# Patient Record
Sex: Female | Born: 1995 | Race: White | Hispanic: No | Marital: Single | Smoking: Never smoker
Health system: Southern US, Community
[De-identification: ages and names within clinical notes are randomized; demographics above are authoritative.]

## PROBLEM LIST (undated history)

## (undated) HISTORY — PX: NO PAST SURGERIES: SHX2092

---

## 2014-07-31 ENCOUNTER — Emergency Department: Payer: Self-pay | Admitting: Student

## 2016-10-26 ENCOUNTER — Encounter: Payer: Self-pay | Admitting: Emergency Medicine

## 2016-10-26 ENCOUNTER — Emergency Department
Admission: EM | Admit: 2016-10-26 | Discharge: 2016-10-26 | Disposition: A | Discharge status: Home / Self Care | Payer: Medicaid Other | Attending: Emergency Medicine | Admitting: Emergency Medicine

## 2016-10-26 DIAGNOSIS — R319 Hematuria, unspecified: Secondary | ICD-10-CM | POA: Insufficient documentation

## 2016-10-26 DIAGNOSIS — R109 Unspecified abdominal pain: Secondary | ICD-10-CM | POA: Diagnosis not present

## 2016-10-26 DIAGNOSIS — Z5321 Procedure and treatment not carried out due to patient leaving prior to being seen by health care provider: Secondary | ICD-10-CM | POA: Insufficient documentation

## 2016-10-26 LAB — CBC
HCT: 42.1 % (ref 35.0–47.0)
Hemoglobin: 14.3 g/dL (ref 12.0–16.0)
MCH: 29.4 pg (ref 26.0–34.0)
MCHC: 34 g/dL (ref 32.0–36.0)
MCV: 86.3 fL (ref 80.0–100.0)
PLATELETS: 437 10*3/uL (ref 150–440)
RBC: 4.88 MIL/uL (ref 3.80–5.20)
RDW: 13.5 % (ref 11.5–14.5)
WBC: 13.9 10*3/uL — AB (ref 3.6–11.0)

## 2016-10-26 LAB — URINALYSIS, COMPLETE (UACMP) WITH MICROSCOPIC
Bacteria, UA: NONE SEEN
SPECIFIC GRAVITY, URINE: 1.021 (ref 1.005–1.030)

## 2016-10-26 LAB — COMPREHENSIVE METABOLIC PANEL
ALBUMIN: 4.3 g/dL (ref 3.5–5.0)
ALK PHOS: 67 U/L (ref 38–126)
ALT: 21 U/L (ref 14–54)
AST: 25 U/L (ref 15–41)
Anion gap: 8 (ref 5–15)
BILIRUBIN TOTAL: 0.4 mg/dL (ref 0.3–1.2)
BUN: 11 mg/dL (ref 6–20)
CALCIUM: 9.8 mg/dL (ref 8.9–10.3)
CO2: 28 mmol/L (ref 22–32)
Chloride: 103 mmol/L (ref 101–111)
Creatinine, Ser: 0.79 mg/dL (ref 0.44–1.00)
GFR calc Af Amer: >60 mL/min (ref >60)
GLUCOSE: 99 mg/dL (ref 65–99)
Potassium: 4.2 mmol/L (ref 3.5–5.1)
Sodium: 139 mmol/L (ref 135–145)
TOTAL PROTEIN: 8.7 g/dL — AB (ref 6.5–8.1)

## 2016-10-26 LAB — POCT PREGNANCY, URINE: PREG TEST UR: NEGATIVE

## 2016-10-26 LAB — LIPASE, BLOOD: Lipase: 21 U/L (ref 11–51)

## 2016-10-26 NOTE — ED Provider Notes (Signed)
I did not evaluate the patient. She left the treatment room prior to my evaluation.   Barbara SemenGraydon Shenna Brissette, MD 10/26/16 [phone]

## 2016-10-26 NOTE — ED Notes (Signed)
Pt and significant other apparently walked out after being put in room and seen by rn but prior to being seen by MD. Pt did not say anything to staff about leaving.

## 2016-10-26 NOTE — ED Notes (Signed)
Pt attempted to urinate prior to triage and was unsuccessful, pt does have specimen cup provided by this tech and will continue to try and advised staff when successful

## 2016-10-26 NOTE — ED Triage Notes (Signed)
Pt to ed with c/o abd pain x 1 week.  Denies diarrhea, reports n/v.  Pt reports blood clots noted in urine, also reports back pain.  Pt reports pain with urination.

## 2016-10-28 ENCOUNTER — Telehealth: Payer: Self-pay | Admitting: Emergency Medicine

## 2016-10-28 NOTE — Telephone Encounter (Signed)
Called patient due to lwot to inquire about condition and follow up plans. pateint says it was taking too long.  I explained that she really needs to see a doctor somewhere, and they need to review her labs.  Explained that blood in urine needs to be investigated to check her kidneys.

## 2017-06-19 DIAGNOSIS — K529 Noninfective gastroenteritis and colitis, unspecified: Secondary | ICD-10-CM | POA: Insufficient documentation

## 2017-06-19 DIAGNOSIS — R112 Nausea with vomiting, unspecified: Secondary | ICD-10-CM | POA: Insufficient documentation

## 2017-06-27 ENCOUNTER — Emergency Department: Payer: Self-pay

## 2017-06-27 ENCOUNTER — Emergency Department
Admission: EM | Admit: 2017-06-27 | Discharge: 2017-06-27 | Disposition: A | Discharge status: Home / Self Care | Payer: Self-pay | Attending: Emergency Medicine | Admitting: Emergency Medicine

## 2017-06-27 ENCOUNTER — Encounter: Payer: Self-pay | Admitting: Radiology

## 2017-06-27 DIAGNOSIS — K5900 Constipation, unspecified: Secondary | ICD-10-CM

## 2017-06-27 DIAGNOSIS — R112 Nausea with vomiting, unspecified: Secondary | ICD-10-CM | POA: Insufficient documentation

## 2017-06-27 DIAGNOSIS — R109 Unspecified abdominal pain: Secondary | ICD-10-CM

## 2017-06-27 DIAGNOSIS — R1030 Lower abdominal pain, unspecified: Secondary | ICD-10-CM | POA: Insufficient documentation

## 2017-06-27 LAB — COMPREHENSIVE METABOLIC PANEL
ALK PHOS: 52 U/L (ref 38–126)
ALT: 35 U/L (ref 14–54)
AST: 30 U/L (ref 15–41)
Albumin: 4.5 g/dL (ref 3.5–5.0)
Anion gap: 12 (ref 5–15)
BILIRUBIN TOTAL: 0.8 mg/dL (ref 0.3–1.2)
BUN: 6 mg/dL (ref 6–20)
CALCIUM: 10.1 mg/dL (ref 8.9–10.3)
CO2: 28 mmol/L (ref 22–32)
CREATININE: 0.83 mg/dL (ref 0.44–1.00)
Chloride: 95 mmol/L — ABNORMAL LOW (ref 101–111)
Glucose, Bld: 107 mg/dL — ABNORMAL HIGH (ref 65–99)
Potassium: 3.1 mmol/L — ABNORMAL LOW (ref 3.5–5.1)
SODIUM: 135 mmol/L (ref 135–145)
Total Protein: 8.2 g/dL — ABNORMAL HIGH (ref 6.5–8.1)

## 2017-06-27 LAB — CBC
HCT: 43.2 % (ref 35.0–47.0)
Hemoglobin: 14.4 g/dL (ref 12.0–16.0)
MCH: 28.3 pg (ref 26.0–34.0)
MCHC: 33.4 g/dL (ref 32.0–36.0)
MCV: 84.9 fL (ref 80.0–100.0)
Platelets: 375 10*3/uL (ref 150–440)
RBC: 5.09 MIL/uL (ref 3.80–5.20)
RDW: 14.2 % (ref 11.5–14.5)
WBC: 13.3 10*3/uL — ABNORMAL HIGH (ref 3.6–11.0)

## 2017-06-27 LAB — LIPASE, BLOOD: Lipase: 60 U/L — ABNORMAL HIGH (ref 11–51)

## 2017-06-27 LAB — POCT PREGNANCY, URINE: Preg Test, Ur: NEGATIVE

## 2017-06-27 MED ORDER — MORPHINE SULFATE (PF) 4 MG/ML IV SOLN
4.0000 mg | Freq: Once | INTRAVENOUS | Status: AC
  Administered 2017-06-27: 4 mg via INTRAVENOUS
  Filled 2017-06-27: qty 1

## 2017-06-27 MED ORDER — SODIUM CHLORIDE 0.9 % IV BOLUS (SEPSIS)
1000.0000 mL | Freq: Once | INTRAVENOUS | Status: AC
Start: 2017-06-27 — End: 2017-06-27
  Administered 2017-06-27: 1000 mL via INTRAVENOUS

## 2017-06-27 MED ORDER — DOCUSATE SODIUM 100 MG PO CAPS: 100.0000 mg | ORAL_CAPSULE | Freq: Every day | ORAL | 0 refills | Status: AC | PRN

## 2017-06-27 MED ORDER — METOCLOPRAMIDE HCL 5 MG/ML IJ SOLN
INTRAMUSCULAR | Status: AC
  Filled 2017-06-27: qty 2

## 2017-06-27 MED ORDER — METOCLOPRAMIDE HCL 10 MG PO TABS: 10.0000 mg | ORAL_TABLET | Freq: Four times a day (QID) | ORAL | 0 refills | Status: DC | PRN

## 2017-06-27 MED ORDER — IOPAMIDOL (ISOVUE-300) INJECTION 61%
30.0000 mL | Freq: Once | INTRAVENOUS | Status: AC | PRN
  Administered 2017-06-27: 30 mL via ORAL
  Filled 2017-06-27: qty 30

## 2017-06-27 MED ORDER — IOPAMIDOL (ISOVUE-300) INJECTION 61%
100.0000 mL | Freq: Once | INTRAVENOUS | Status: AC | PRN
  Administered 2017-06-27: 100 mL via INTRAVENOUS
  Filled 2017-06-27: qty 100

## 2017-06-27 MED ORDER — ONDANSETRON HCL 4 MG/2ML IJ SOLN
4.0000 mg | Freq: Once | INTRAMUSCULAR | Status: AC
  Administered 2017-06-27: 4 mg via INTRAVENOUS
  Filled 2017-06-27: qty 2

## 2017-06-27 MED ORDER — METOCLOPRAMIDE HCL 5 MG/ML IJ SOLN
10.0000 mg | Freq: Once | INTRAMUSCULAR | Status: AC
  Administered 2017-06-27: 10 mg via INTRAVENOUS

## 2017-06-27 NOTE — ED Notes (Signed)
Iv started   meds given   Pt drinking po contrast  Family with pt

## 2017-06-27 NOTE — ED Notes (Signed)

## 2017-06-27 NOTE — ED Notes (Signed)
Called CT that patient done with contrast

## 2017-06-27 NOTE — ED Provider Notes (Addendum)
[facility] Emergency Department Provider Note  ____________________________________________   First MD Initiated Contact with Patient 06/27/17 1804     (approximate)  I have reviewed the triage vital signs and the nursing notes.   HISTORY  Chief Complaint Emesis   HPI Barbara Aguilar is a [age] y.o. female with a recent diagnosis of colitis 1 week ago who is presenting to the emergency department today with a 6 out of 10 lower abdominal cramping. She says that she is concerned because after being [facility] one [facility] she has continued to have nausea vomiting and constipation. She says that she is unable to tolerate anything by mouth including Cipro and Flagyl which she was given for home use. She says that she is also on her period and thought that she may be having pain from. Cramps but she says this pain is much worse. Also tried MiraLAX without any bowel movements over the past 3 days.   History reviewed. No pertinent past medical history.  There are no active problems to display for this patient.   No past surgical history on file.  Prior to Admission medications   Not on File    Allergies Patient has no known allergies.  No family history on file.  Social History Social History  Substance Use Topics  . Smoking status: Never Smoker  . Smokeless tobacco: Never Used  . Alcohol use No    Review of Systems  Constitutional: No fever/chills Eyes: No visual changes. ENT: No sore throat. Cardiovascular: Denies chest pain. Respiratory: Denies shortness of breath. Gastrointestinal:  No diarrhea.   Genitourinary: Negative for dysuria. Musculoskeletal: Negative for back pain. Skin: Negative for rash. Neurological: Negative for headaches, focal weakness or numbness.   ____________________________________________   PHYSICAL EXAM:  VITAL SIGNS: ED Triage Vitals [06/27/17 1619]  Enc Vitals Group     BP (!) 144/92     Pulse  Rate 80     Resp 15     Temp 98.5 F (36.9 C)     Temp Source Oral     SpO2 99 %     Weight 230 lb (104.3 kg)     Height 5\' 4"  (1.626 m)     Head Circumference      Peak Flow      Pain Score 7     Pain Loc      Pain Edu?      Excl. in GC?     Constitutional: Alert and oriented. Well appearing and in no acute distress. Eyes: Conjunctivae are normal.  Head: Atraumatic. Nose: No congestion/rhinnorhea. Mouth/Throat: Mucous membranes are moist.  Neck: No stridor.   Cardiovascular: Normal rate, regular rhythm. Grossly normal heart sounds.  Respiratory: Normal respiratory effort.  No retractions. Lungs CTAB. Gastrointestinal: Soft and nontender. No distention. No CVA tenderness. Musculoskeletal: No lower extremity tenderness nor edema.  No joint effusions. Neurologic:  Normal speech and language. No gross focal neurologic deficits are appreciated. Skin:  Skin is warm, dry and intact. No rash noted. Psychiatric: Mood and affect are normal. Speech and behavior are normal.  ____________________________________________   LABS (all labs ordered are listed, but only abnormal results are displayed)  Labs Reviewed  LIPASE, BLOOD - Abnormal; Notable for the following:       Result Value   Lipase 60 (*)    All other components within normal limits  COMPREHENSIVE METABOLIC PANEL - Abnormal; Notable for the following:    Potassium 3.1 (*)  Chloride 95 (*)    Glucose, Bld 107 (*)    Total Protein 8.2 (*)    All other components within normal limits  CBC - Abnormal; Notable for the following:    WBC 13.3 (*)    All other components within normal limits  URINALYSIS, COMPLETE (UACMP) WITH MICROSCOPIC  POCT PREGNANCY, URINE   ____________________________________________  EKG   ____________________________________________  RADIOLOGY  No acute pathology on the CT scan of the abdomen and pelvis. ____________________________________________   PROCEDURES  Procedure(s)  performed:   Procedures  Critical Care performed:   ____________________________________________   INITIAL IMPRESSION / ASSESSMENT AND PLAN / ED COURSE  Pertinent labs & imaging results that were available during my care of the patient were reviewed by me and considered in my medical decision making (see chart for details).    Clinical Course as of Jun 27 2216  Mon Jun 27, 2017  2101 Patient vomited her contrast. Says that she is still experiencing her nausea. CT scan was reassuring.  [DS]    Clinical Course User Index [DS] Myrna Blazer, MD   ----------------------------------------- 10:17 PM on 06/27/2017 -----------------------------------------  Patient is pain-free at this time and able to tolerate by mouth fluids and solids after Reglan. She'll be discharge with Reglan as well as Colace. She will discontinue her MiraLAX. Patient is understanding the plan and willing to comply. She has follow-up scheduled for October a [facility] primary care as this is the soonest ointment that she can obtain. She knows to return to the hospital for any worsening or concerning symptoms. Repeat exam With very minimal tenderness to palpation diffusely. Patient without any distress at this time.  ____________________________________________   FINAL CLINICAL IMPRESSION(S) / ED DIAGNOSES  Abdominal pain with nausea and vomiting.     NEW MEDICATIONS STARTED DURING THIS VISIT:  New Prescriptions   No medications on file     Note:  This document was prepared using Dragon voice recognition software and may include unintentional dictation errors.     Myrna Blazer, MD 06/27/17 2218    Myrna Blazer, MD 06/27/17 2226

## 2017-06-27 NOTE — ED Notes (Signed)
Patient presents to the ED with lower abdominal pain, heart burn and vomiting since Saturday.  Patient also reports constipation x 3 days.  Patient states abdomen feels sore from vomiting as well.  Patient had a similar episode approx. 1 week ago.  Patient states if she eats or drinks pain and vomiting increases.

## 2017-06-27 NOTE — ED Triage Notes (Signed)
[facility] for [facility] and constipation, admitted over night. Pt reports continued constipation, abdominal pain and vomiting. Pt with 200cc of yellow vomit in bag in triage.

## 2017-07-04 ENCOUNTER — Emergency Department
Admission: EM | Admit: 2017-07-04 | Discharge: 2017-07-04 | Disposition: A | Discharge status: Home / Self Care | Payer: Medicaid Other | Attending: Emergency Medicine | Admitting: Emergency Medicine

## 2017-07-04 ENCOUNTER — Encounter: Payer: Self-pay | Admitting: Emergency Medicine

## 2017-07-04 DIAGNOSIS — R112 Nausea with vomiting, unspecified: Secondary | ICD-10-CM

## 2017-07-04 DIAGNOSIS — R197 Diarrhea, unspecified: Secondary | ICD-10-CM

## 2017-07-04 DIAGNOSIS — R1111 Vomiting without nausea: Secondary | ICD-10-CM | POA: Insufficient documentation

## 2017-07-04 LAB — URINE DRUG SCREEN, QUALITATIVE (ARMC ONLY)
Amphetamines, Ur Screen: NOT DETECTED
BENZODIAZEPINE, UR SCRN: NOT DETECTED
Barbiturates, Ur Screen: NOT DETECTED
CANNABINOID 50 NG, UR ~~LOC~~: POSITIVE — AB
Cocaine Metabolite,Ur ~~LOC~~: NOT DETECTED
MDMA (ECSTASY) UR SCREEN: NOT DETECTED
METHADONE SCREEN, URINE: NOT DETECTED
Opiate, Ur Screen: NOT DETECTED
Phencyclidine (PCP) Ur S: NOT DETECTED
TRICYCLIC, UR SCREEN: NOT DETECTED

## 2017-07-04 LAB — CBC
HEMATOCRIT: 40.9 % (ref 35.0–47.0)
Hemoglobin: 14.1 g/dL (ref 12.0–16.0)
MCH: 29.5 pg (ref 26.0–34.0)
MCHC: 34.4 g/dL (ref 32.0–36.0)
MCV: 85.9 fL (ref 80.0–100.0)
Platelets: 400 10*3/uL (ref 150–440)
RBC: 4.76 MIL/uL (ref 3.80–5.20)
RDW: 14.2 % (ref 11.5–14.5)
WBC: 12.3 10*3/uL — ABNORMAL HIGH (ref 3.6–11.0)

## 2017-07-04 LAB — URINALYSIS, COMPLETE (UACMP) WITH MICROSCOPIC
BACTERIA UA: NONE SEEN
Bilirubin Urine: NEGATIVE
Glucose, UA: NEGATIVE mg/dL
Hgb urine dipstick: NEGATIVE
Ketones, ur: 20 mg/dL — AB
Leukocytes, UA: NEGATIVE
NITRITE: NEGATIVE
Protein, ur: 30 mg/dL — AB
Specific Gravity, Urine: 1.021 (ref 1.005–1.030)
pH: 6 (ref 5.0–8.0)

## 2017-07-04 LAB — COMPREHENSIVE METABOLIC PANEL
ALBUMIN: 4.5 g/dL (ref 3.5–5.0)
ALT: 43 U/L (ref 14–54)
AST: 41 U/L (ref 15–41)
Alkaline Phosphatase: 51 U/L (ref 38–126)
Anion gap: 10 (ref 5–15)
BILIRUBIN TOTAL: 0.6 mg/dL (ref 0.3–1.2)
CO2: 25 mmol/L (ref 22–32)
Calcium: 9.9 mg/dL (ref 8.9–10.3)
Chloride: 103 mmol/L (ref 101–111)
Creatinine, Ser: 0.71 mg/dL (ref 0.44–1.00)
GFR calc Af Amer: >60 mL/min (ref >60)
GFR calc non Af Amer: >60 mL/min (ref >60)
GLUCOSE: 141 mg/dL — AB (ref 65–99)
POTASSIUM: 3.7 mmol/L (ref 3.5–5.1)
Sodium: 138 mmol/L (ref 135–145)
TOTAL PROTEIN: 8.1 g/dL (ref 6.5–8.1)

## 2017-07-04 LAB — POCT PREGNANCY, URINE: PREG TEST UR: NEGATIVE

## 2017-07-04 LAB — LIPASE, BLOOD: Lipase: 42 U/L (ref 11–51)

## 2017-07-04 MED ORDER — ONDANSETRON 4 MG PO TBDP
4.0000 mg | ORAL_TABLET | Freq: Once | ORAL | Status: AC | PRN
  Administered 2017-07-04: 4 mg via ORAL
  Filled 2017-07-04: qty 1

## 2017-07-04 MED ORDER — SODIUM CHLORIDE 0.9 % IV BOLUS (SEPSIS)
1000.0000 mL | Freq: Once | INTRAVENOUS | Status: AC
  Administered 2017-07-04: 1000 mL via INTRAVENOUS

## 2017-07-04 MED ORDER — METOCLOPRAMIDE HCL 10 MG PO TABS: 10.0000 mg | ORAL_TABLET | Freq: Four times a day (QID) | ORAL | 0 refills | Status: DC | PRN

## 2017-07-04 MED ORDER — HALOPERIDOL LACTATE 5 MG/ML IJ SOLN
2.5000 mg | Freq: Once | INTRAMUSCULAR | Status: AC
  Administered 2017-07-04: 2.5 mg via INTRAVENOUS
  Filled 2017-07-04: qty 1
  Filled 2017-07-04: qty 0.5

## 2017-07-04 MED ORDER — PROMETHAZINE HCL 25 MG/ML IJ SOLN
12.5000 mg | Freq: Once | INTRAMUSCULAR | Status: AC
Start: 2017-07-04 — End: 2017-07-04
  Administered 2017-07-04: 12.5 mg via INTRAVENOUS
  Filled 2017-07-04: qty 1

## 2017-07-04 NOTE — ED Triage Notes (Signed)
FIRST NURSE NOTE-PT abdominal problems. Seen here last week for same, ran out of nausea medicine and cant see doctor until October.

## 2017-07-04 NOTE — ED Notes (Signed)
Pt not able to hold water down that was given. Some emesis noted in emesis bag. Will notify edp.

## 2017-07-04 NOTE — ED Provider Notes (Addendum)
[facility] Emergency Department Provider Note  ____________________________________________   I have reviewed the triage vital signs and the nursing notes.   HISTORY  Chief Complaint Abdominal Pain and Emesis    HPI Barbara Aguilar is a 21 y.o. female Who presents today complaining of intermittent episodes of vomiting. This has been going on for well over a month she states.she was advised to stop using marijuanabecause of this several weeks ago, and states that she has not recently used it. Hshe has had 2 negative CT scans in the last 3-4 weeks because of this recurrent symptom. She had something similara year and a half ago she states. She is supposed to follow closely with GI medicine but has yet to be able to get into see them. She did have some loose stools this time. She denies any fever or chills, she states besides the vomiting she has no other complaints at this time except for a few loose stools. She was better for the whole week that over the weekend she began to have symptoms again. She denies using marijuana again.no recent antibiotics no recent travel no vaginal discharge no symptoms of PID declines pelvic exam.   History reviewed. No pertinent past medical history.  There are no active problems to display for this patient.   History reviewed. No pertinent surgical history.  Prior to Admission medications   Medication Sig Start Date End Date Taking? Authorizing Provider  docusate sodium (COLACE) 100 MG capsule Take 1 capsule (100 mg total) by mouth daily as needed for mild constipation or moderate constipation. 06/27/17 06/27/18  Myrna Blazer, MD  metoCLOPramide (REGLAN) 10 MG tablet Take 1 tablet (10 mg total) by mouth every 6 (six) hours as needed. 06/27/17   Schaevitz, Myra Rude, MD    Allergies Patient has no known allergies.  No family history on file.  Social History Social History  Substance Use Topics  . Smoking  status: Never Smoker  . Smokeless tobacco: Never Used  . Alcohol use No    Review of Systems Constitutional: No fever/chills Eyes: No visual changes. ENT: No sore throat. No stiff neck no neck pain Cardiovascular: Denies chest pain. Respiratory: Denies shortness of breath. Gastrointestinal:   see history of present illness Genitourinary: Negative for dysuria. Musculoskeletal: Negative lower extremity swelling Skin: Negative for rash. Neurological: Negative for severe headaches, focal weakness or numbness.   ____________________________________________   PHYSICAL EXAM:  VITAL SIGNS: ED Triage Vitals  Enc Vitals Group     BP 07/04/17 1112 (!) 151/92     Pulse Rate 07/04/17 1112 90     Resp 07/04/17 1112 16     Temp 07/04/17 1112 99.3 F (37.4 C)     Temp Source 07/04/17 1112 Oral     SpO2 07/04/17 1112 98 %     Weight 07/04/17 1113 230 lb (104.3 kg)     Height 07/04/17 1113 5\' 4"  (1.626 m)     Head Circumference --      Peak Flow --      Pain Score 07/04/17 1112 9     Pain Loc --      Pain Edu? --      Excl. in GC? --     Constitutional: Alert and oriented. Well appearing and in no acute distress. Eyes: Conjunctivae are normal Head: Atraumatic HEENT: No congestion/rhinnorhea. Mucous membranes are moist.  Oropharynx non-erythematous Neck:   Nontender with no meningismus, no masses, no stridor Cardiovascular: Normal rate, regular rhythm.  Grossly normal heart sounds.  Good peripheral circulation. Respiratory: Normal respiratory effort.  No retractions. Lungs CTAB. Abdominal: Soft and nontender. No distention. No guarding no rebound Back:  There is no focal tenderness or step off.  there is no midline tenderness there are no lesions noted. there is no CVA tenderness Declines pelvic Musculoskeletal: No lower extremity tenderness, no upper extremity tenderness. No joint effusions, no DVT signs strong distal pulses no edema Neurologic:  Normal speech and language. No  gross focal neurologic deficits are appreciated.  Skin:  Skin is warm, dry and intact. No rash noted. Psychiatric: Mood and affect are normal. Speech and behavior are normal.  ____________________________________________   LABS (all labs ordered are listed, but only abnormal results are displayed)  Labs Reviewed  COMPREHENSIVE METABOLIC PANEL - Abnormal; Notable for the following:       Result Value   Glucose, Bld 141 (*)    BUN <5 (*)    All other components within normal limits  CBC - Abnormal; Notable for the following:    WBC 12.3 (*)    All other components within normal limits  URINALYSIS, COMPLETE (UACMP) WITH MICROSCOPIC - Abnormal; Notable for the following:    Color, Urine YELLOW (*)    APPearance HAZY (*)    Ketones, ur 20 (*)    Protein, ur 30 (*)    Squamous Epithelial / LPF 6-30 (*)    All other components within normal limits  URINE DRUG SCREEN, QUALITATIVE ([facility] ONLY) - Abnormal; Notable for the following:    Cannabinoid 50 Ng, Ur [place] POSITIVE (*)    All other components within normal limits  LIPASE, BLOOD  POC URINE PREG, ED  POCT PREGNANCY, URINE   ____________________________________________  EKG  I personally interpreted any EKGs ordered by me or triage  ____________________________________________  RADIOLOGY  I reviewed any imaging ordered by me or triage that were performed during my shift and, if possible, patient and/or family made aware of any abnormal findings. ____________________________________________   PROCEDURES  Procedure(s) performed: None  Procedures  Critical Care performed: None  ____________________________________________   INITIAL IMPRESSION / ASSESSMENT AND PLAN / ED COURSE  Pertinent labs & imaging results that were available during my care of the patient were reviewed by me and considered in my medical decision making (see chart for details).  Patient with chronic recurrent nausea and vomiting presents today with  nausea and vomiting. She is very well-appearing. Her abdomen is benign. She's had 2 negative CT scans for this. There is no focal right upper quadrant pain or elevated liver function tests to suggest this is a gallbladder issue.nothing to suggest pancreatic issue,appendicitis pregnancy or PID and she declines pelvic exam. Nothing to suggest ovarian cyst given chronicity and exam.patient is positive again for cannabinoid. My suspicion that this is possibly related to marijuana abuse. I have counseled her on this again. She will require close outpatient follow-up with GI. She is not markedly dehydrated. Nonetheless we gave her fluids. She did express understanding of the fact that she is not supposed to drive after getting Phengan. She states her nausea was very well controlled with the Reglan she went home with last time. However, she has run out.  ----------------------------------------- 5:19 PM on 07/04/2017 -----------------------------------------  Abdomen remains benign however patient has vomited again. We will try Haldol as an my expenses to his work well for these types of emesis. Patient is positive for cannabis despite telling me that she has not used it for 3  weeks. She states "maybe had been around people using it"  don't think there is any utility in repeating imaging she declines pelvic exam she has no right upper quadrant pain and her gallbladder is visualized twice on CT is completely normal despite ell over a month of symptoms that she now describes it, liver function tests are reassuring, etc. I don't think ultrasound is indicated. We will continue to treat her symptoms.    ____________________________________________   FINAL CLINICAL IMPRESSION(S) / ED DIAGNOSES  Final diagnoses:  None      This chart was dictated using voice recognition software.  Despite best efforts to proofread,  errors can occur which can change meaning.      Jeanmarie Plant, MD 07/04/17 1631     Jeanmarie Plant, MD 07/04/17 [phone]

## 2017-07-04 NOTE — Discharge Instructions (Signed)
Return to the emergency room for new or worrisome symptoms. It is possible, that the vomiting is because of marijuana use. Please do not smoke marijuana. If you have severe pain, persistent vomiting or new or worsening symptoms please return to the emergency department. This would include fever, vaginal discharge, or any other concerning symptoms. Do not drive after receiving Phenergan in the emergency room.

## 2017-07-04 NOTE — ED Triage Notes (Signed)
Pt says she has had vomiting, nausea for about a month.  Is scheduled for gi in October.  Ran out of nausea med and cant keep anything down.

## 2017-07-04 NOTE — ED Notes (Signed)
Pt asking for something to drink, edp notified and orders give pt water PO and hold off on IV fluids to see if she can tolerate.

## 2017-07-08 ENCOUNTER — Emergency Department
Admission: EM | Admit: 2017-07-08 | Discharge: 2017-07-08 | Disposition: A | Discharge status: Home / Self Care | Payer: Self-pay | Attending: Emergency Medicine | Admitting: Emergency Medicine

## 2017-07-08 ENCOUNTER — Encounter: Payer: Self-pay | Admitting: *Deleted

## 2017-07-08 DIAGNOSIS — G8929 Other chronic pain: Secondary | ICD-10-CM | POA: Insufficient documentation

## 2017-07-08 DIAGNOSIS — R112 Nausea with vomiting, unspecified: Secondary | ICD-10-CM | POA: Insufficient documentation

## 2017-07-08 DIAGNOSIS — Z79899 Other long term (current) drug therapy: Secondary | ICD-10-CM | POA: Insufficient documentation

## 2017-07-08 DIAGNOSIS — R109 Unspecified abdominal pain: Secondary | ICD-10-CM | POA: Insufficient documentation

## 2017-07-08 LAB — COMPREHENSIVE METABOLIC PANEL
ALK PHOS: 46 U/L (ref 38–126)
ALT: 29 U/L (ref 14–54)
AST: 31 U/L (ref 15–41)
Albumin: 4.4 g/dL (ref 3.5–5.0)
Anion gap: 9 (ref 5–15)
BILIRUBIN TOTAL: 0.6 mg/dL (ref 0.3–1.2)
CO2: 26 mmol/L (ref 22–32)
CREATININE: 1.03 mg/dL — AB (ref 0.44–1.00)
Calcium: 9.9 mg/dL (ref 8.9–10.3)
Chloride: 101 mmol/L (ref 101–111)
GFR calc Af Amer: >60 mL/min (ref >60)
Glucose, Bld: 137 mg/dL — ABNORMAL HIGH (ref 65–99)
Potassium: 3.5 mmol/L (ref 3.5–5.1)
Sodium: 136 mmol/L (ref 135–145)
TOTAL PROTEIN: 8 g/dL (ref 6.5–8.1)

## 2017-07-08 LAB — CBC
HCT: 42 % (ref 35.0–47.0)
Hemoglobin: 14 g/dL (ref 12.0–16.0)
MCH: 29.1 pg (ref 26.0–34.0)
MCHC: 33.5 g/dL (ref 32.0–36.0)
MCV: 87.1 fL (ref 80.0–100.0)
PLATELETS: 390 10*3/uL (ref 150–440)
RBC: 4.82 MIL/uL (ref 3.80–5.20)
RDW: 14 % (ref 11.5–14.5)
WBC: 11.1 10*3/uL — AB (ref 3.6–11.0)

## 2017-07-08 LAB — LIPASE, BLOOD: Lipase: 24 U/L (ref 11–51)

## 2017-07-08 LAB — URINALYSIS, COMPLETE (UACMP) WITH MICROSCOPIC
BILIRUBIN URINE: NEGATIVE
Glucose, UA: NEGATIVE mg/dL
HGB URINE DIPSTICK: NEGATIVE
Ketones, ur: 5 mg/dL — AB
LEUKOCYTES UA: NEGATIVE
NITRITE: NEGATIVE
PH: 7 (ref 5.0–8.0)
Protein, ur: 30 mg/dL — AB
SPECIFIC GRAVITY, URINE: 1.019 (ref 1.005–1.030)

## 2017-07-08 MED ORDER — SODIUM CHLORIDE 0.9 % IV BOLUS (SEPSIS)
1000.0000 mL | Freq: Once | INTRAVENOUS | Status: AC
  Administered 2017-07-08: 1000 mL via INTRAVENOUS

## 2017-07-08 MED ORDER — METOCLOPRAMIDE HCL 10 MG PO TABS: 10.0000 mg | ORAL_TABLET | Freq: Four times a day (QID) | ORAL | 0 refills | Status: AC | PRN

## 2017-07-08 MED ORDER — ONDANSETRON HCL 4 MG/2ML IJ SOLN
4.0000 mg | Freq: Once | INTRAMUSCULAR | Status: AC
  Administered 2017-07-08: 4 mg via INTRAVENOUS
  Filled 2017-07-08: qty 2

## 2017-07-08 MED ORDER — METOCLOPRAMIDE HCL 5 MG/ML IJ SOLN
10.0000 mg | Freq: Once | INTRAMUSCULAR | Status: AC
  Administered 2017-07-08: 10 mg via INTRAVENOUS
  Filled 2017-07-08: qty 2

## 2017-07-08 MED ORDER — ONDANSETRON 4 MG PO TBDP: 4.0000 mg | ORAL_TABLET | Freq: Three times a day (TID) | ORAL | 0 refills | Status: AC | PRN

## 2017-07-08 NOTE — ED Provider Notes (Signed)
[facility] Emergency Department Provider Note  ____________________________________________   None    (approximate)  I have reviewed the triage vital signs and the nursing notes.   HISTORY  Chief Complaint Abdominal Pain   HPI Barbara Aguilar is a 21 y.o. female is here complaining of diffused abdominal pain with nausea and vomiting. Patient has an appointment with the GI department on Monday. Patient has been in the emergency department numerous times with same symptoms. Patient has had numerous workups for the same which have been negative. She states that she has been taking Reglan since her last ER visit but only has to tablets left. She denies any fever or chills. Patient states this is the same symptoms that she has experienced in the past. She continues to drink water. Patient states that she was going to see someone in the GI department but could not get an appointment until October. She called today and is able to see someone on Monday morning. Currently she rates her pain is 3/10.   History reviewed. No pertinent past medical history.  There are no active problems to display for this patient.   History reviewed. No pertinent surgical history.  Prior to Admission medications   Medication Sig Start Date End Date Taking? Authorizing Provider  docusate sodium (COLACE) 100 MG capsule Take 1 capsule (100 mg total) by mouth daily as needed for mild constipation or moderate constipation. 06/27/17 06/27/18  Myrna Blazer, MD  metoCLOPramide (REGLAN) 10 MG tablet Take 1 tablet (10 mg total) by mouth every 6 (six) hours as needed. 07/08/17   Tommi Rumps, PA-C  ondansetron (ZOFRAN ODT) 4 MG disintegrating tablet Take 1 tablet (4 mg total) by mouth every 8 (eight) hours as needed for nausea or vomiting. 07/08/17   Tommi Rumps, PA-C    Allergies Patient has no known allergies.  No family history on file.  Social History Social  History  Substance Use Topics  . Smoking status: Never Smoker  . Smokeless tobacco: Never Used  . Alcohol use No    Review of Systems  Constitutional: No fever/chills Cardiovascular: Denies chest pain. Respiratory: Denies shortness of breath. Gastrointestinal: No abdominal pain.  Positive nausea, positive vomiting.  No diarrhea.  No constipation. Genitourinary: Negative for dysuria. Musculoskeletal: Negative for back pain. Skin: Negative for rash. Neurological: Negative for headaches, focal weakness or numbness.   ____________________________________________   PHYSICAL EXAM:  VITAL SIGNS: ED Triage Vitals  Enc Vitals Group     BP 07/08/17 1235 140/90     Pulse Rate 07/08/17 1228 84     Resp 07/08/17 1228 18     Temp 07/08/17 1228 98.7 F (37.1 C)     Temp Source 07/08/17 1228 Oral     SpO2 07/08/17 1228 98 %     Weight 07/08/17 1229 230 lb (104.3 kg)     Height 07/08/17 1229 5\' 4"  (1.626 m)     Head Circumference --      Peak Flow --      Pain Score 07/08/17 1228 9     Pain Loc --      Pain Edu? --      Excl. in GC? --    Constitutional: Alert and oriented. Well appearing and in no acute distress. Eyes: Conjunctivae are normal. PERRL. EOMI. Head: Atraumatic. Neck: No stridor.   Cardiovascular: Normal rate, regular rhythm. Grossly normal heart sounds.  Good peripheral circulation. Respiratory: Normal respiratory effort.  No retractions. Lungs  CTAB. Gastrointestinal: Soft and nontender. No distention. Bowel sounds normoactive 4 quadrants at present. Musculoskeletal: Moves upper and lower extremities without any difficulty. Neurologic:  Normal speech and language. No gross focal neurologic deficits are appreciated. No gait instability. Skin:  Skin is warm, dry and intact. No rash noted. Psychiatric: Mood and affect are normal. Speech and behavior are normal.  ____________________________________________   LABS (all labs ordered are listed, but only abnormal  results are displayed)  Labs Reviewed  COMPREHENSIVE METABOLIC PANEL - Abnormal; Notable for the following:       Result Value   Glucose, Bld 137 (*)    BUN <5 (*)    Creatinine, Ser 1.03 (*)    All other components within normal limits  CBC - Abnormal; Notable for the following:    WBC 11.1 (*)    All other components within normal limits  URINALYSIS, COMPLETE (UACMP) WITH MICROSCOPIC - Abnormal; Notable for the following:    Color, Urine YELLOW (*)    APPearance CLOUDY (*)    Ketones, ur 5 (*)    Protein, ur 30 (*)    Bacteria, UA RARE (*)    Squamous Epithelial / LPF 6-30 (*)    All other components within normal limits  LIPASE, BLOOD  POC URINE PREG, ED     PROCEDURES  Procedure(s) performed: None  Procedures  Critical Care performed: No  ____________________________________________   INITIAL IMPRESSION / ASSESSMENT AND PLAN / ED COURSE  Pertinent labs & imaging results that were available during my care of the patient were reviewed by me and considered in my medical decision making (see chart for details).  Patient with chronic intermittent abdominal pain with nausea and vomiting. Lab work in the past and today was reviewed. Patient was given 1 L of fluids along with IV Zofran and Reglan with resolution of her nausea and vomiting. Patient is encouraged to keep her appointment on Monday for further workup of her GI complaints. She was given Reglan and Zofran for the weekend. No further vomiting prior to discharge.  ____________________________________________   FINAL CLINICAL IMPRESSION(S) / ED DIAGNOSES  Final diagnoses:  Chronic abdominal pain  Non-intractable vomiting with nausea, unspecified vomiting type      NEW MEDICATIONS STARTED DURING THIS VISIT:  New Prescriptions   ONDANSETRON (ZOFRAN ODT) 4 MG DISINTEGRATING TABLET    Take 1 tablet (4 mg total) by mouth every 8 (eight) hours as needed for nausea or vomiting.     Note:  This document was  prepared using Dragon voice recognition software and may include unintentional dictation errors.    Tommi Rumps, PA-C 07/08/17 1557    Jeanmarie Plant, MD 07/09/17 [phone]

## 2017-07-08 NOTE — ED Notes (Signed)
POC urine preg Negative per [facility] paramedic

## 2017-07-08 NOTE — ED Triage Notes (Signed)
Pt has been in ER multiple times for diffuse abdominal pain with nausea and vomiting, pt has GI appointment on Monday

## 2017-07-08 NOTE — Discharge Instructions (Signed)
Keep your Appointment on Monday with GI Department. Continue Reglan 10 mg one every 6 hours as needed. Zofran ODT as needed for nausea.

## 2017-07-11 ENCOUNTER — Ambulatory Visit (INDEPENDENT_AMBULATORY_CARE_PROVIDER_SITE_OTHER): Payer: Self-pay | Admitting: Gastroenterology

## 2017-07-11 ENCOUNTER — Encounter: Payer: Self-pay | Admitting: Gastroenterology

## 2017-07-11 ENCOUNTER — Other Ambulatory Visit: Payer: Self-pay

## 2017-07-11 VITALS — BP 139/87 | HR 103 | Temp 98.3°F | Ht 66.0 in | Wt 237.6 lb

## 2017-07-11 DIAGNOSIS — K921 Melena: Secondary | ICD-10-CM

## 2017-07-11 DIAGNOSIS — R112 Nausea with vomiting, unspecified: Secondary | ICD-10-CM

## 2017-07-11 DIAGNOSIS — R197 Diarrhea, unspecified: Secondary | ICD-10-CM

## 2017-07-11 MED ORDER — AMITRIPTYLINE HCL 50 MG PO TABS: 50.0000 mg | ORAL_TABLET | Freq: Every day | ORAL | 1 refills | Status: AC

## 2017-07-11 NOTE — Progress Notes (Signed)
Arlyss Repress, MD [address]  [address]  [place], [place] [address]  Main: [phone]  Fax: [phone]    Gastroenterology Consultation  Referring Provider:     No ref. provider found Primary Care Physician:  Patient, No Pcp Per Primary Gastroenterologist:  Dr. Arlyss Repress Reason for Consultation:     Nausea, vomiting, loose stools        HPI:   Barbara Aguilar is a 21 y.o. y/o female referred by Dr. Patient, No Pcp Per  for consultation & management of Nausea, vomiting, loose stools. Her symptoms started in July after she returned from vacation to [place]. She reports having severe nausea, nonbloody emesis associated with lower abdominal cramping pain followed by one to 2 loose bowel movements. These episodes occur for few days in a week. In between episodes, she is normal and particularly when she is busy at work. She also reports having constipation and was given Colace which has resulted in loose bowel movements. She is currently not taking Colace. She had at least 4 ER visits in August. Her initial CT scan at [facility] revealed mild left colon thickening, mild leukocytosis and she was discharged home on Cipro and Flagyl. She was also seen by GI in the ER and was thought to be secondary to cannabis hyperemesis. She is given Zofran for nausea. She subsequently went to ER [facility] at least 3 times for the same symptoms, she had mild leukocytosis but her electrolytes have been normal. She was given Reglan for her symptoms. She reports that she lost about 20 pounds since July. Reports lower abdominal pain but denies any blood in the stools. She denies fever, chills, family history of IBD. She denies using cannabis but her urine test came back positive on 07/04/2017. She denies smoking, alcohol, NSAIDs use. She reports that her menstrual cycles are regular and no worsening of GI symptoms at that time. She denies any GI surgeries.  GI Procedures:  None  No past medical history on file.  No past surgical history on file.  Prior to Admission medications   Medication Sig Start Date End Date Taking? Authorizing Provider  docusate sodium (COLACE) 100 MG capsule Take 1 capsule (100 mg total) by mouth daily as needed for mild constipation or moderate constipation. 06/27/17 06/27/18 Yes Myrna Blazer, MD  ondansetron (ZOFRAN) 4 MG tablet Take 4 mg by mouth. 06/20/17  Yes [provider]  metoCLOPramide (REGLAN) 10 MG tablet Take 1 tablet (10 mg total) by mouth every 6 (six) hours as needed. Patient not taking: Reported on 07/11/2017 07/08/17   Bridget Hartshorn L, PA-C  ondansetron (ZOFRAN ODT) 4 MG disintegrating tablet Take 1 tablet (4 mg total) by mouth every 8 (eight) hours as needed for nausea or vomiting. Patient not taking: Reported on 07/11/2017 07/08/17   Tommi Rumps, PA-C  Probiotic Product ([employer] PROBIOTIC) CAPS Take by mouth. 06/16/17 07/16/17  [provider]  PROMETHEGAN 25 MG suppository UNWRAP AND INSERT 1 SUPPOSITORY INTO THE RECTUM Q 6 H PRN N FOR UP TO 7 DAYS 06/17/17   [provider]    No family history on file.   Social History  Substance Use Topics  . Smoking status: Never Smoker  . Smokeless tobacco: Never Used  . Alcohol use No    Allergies as of 07/11/2017  . (No Known Allergies)    Review of Systems:    All systems reviewed and negative except where noted  in HPI.   Physical Exam:  BP 139/87   Pulse (!) 103   Temp 98.3 F (36.8 C) (Oral)   Ht 5\' 6"  (1.676 m)   Wt 237 lb 9.6 oz (107.8 kg)   LMP 06/24/2017 Comment: neg preg test  BMI 38.35 kg/m  Patient's last menstrual period was 06/24/2017.  General:   Alert,  Well-developed, well-nourished, pleasant and cooperative in NAD, obese Head:  Normocephalic and atraumatic. Eyes:  Sclera clear, no icterus.   Conjunctiva pink. Ears:  Normal auditory acuity. Nose:  No deformity, discharge, or lesions. Mouth:  No deformity or  lesions,oropharynx pink & moist. Neck:  Supple; no masses or thyromegaly. Lungs:  Respirations even and unlabored.  Clear throughout to auscultation.   No wheezes, crackles, or rhonchi. No acute distress. Heart:  Regular rate and rhythm; no murmurs, clicks, rubs, or gallops. Abdomen:  Normal bowel sounds.  No bruits.  Soft, non-tender and non-distended without masses, hepatosplenomegaly or hernias noted.  No guarding or rebound tenderness.   Rectal: Nor performed Msk:  Symmetrical without gross deformities. Good, equal movement & strength bilaterally. Pulses:  Normal pulses noted. Extremities:  No clubbing or edema.  No cyanosis. Neurologic:  Alert and oriented x3;  grossly normal neurologically. Skin:  Intact without significant lesions or rashes. No jaundice. Lymph Nodes:  No significant cervical adenopathy. Psych:  Alert and cooperative. Normal mood and affect.  Imaging Studies: Reviewed CT A/P 06/16/2017 at [facility] Result Impression  --Colonic wall thickening and edema in the distal transverse and descending colon may reflect colitis. --Normal-appearing appendix. ADDENDUM: Descending and transverse colon are decompressed. As a consequence, the mild wall thickening may besecondary to decompressed colon rather than a sign of colitis.     Assessment and Plan:   Barbara Aguilar is a 21 y.o. y/o female with Cannabis use, 1 month history of nausea, nonbloody emesis, loose frequent stools associated with lower abdominal cramps and weight loss. Most likely her GI symptoms are functional, possibly secondary to cannabis hyperemesis. She does have mild leukocytosis during these episodes which could be reactive. Given weight loss, I recommend EGD and colonoscopy with biopsies. Meantime, I'll start her on amitriptyline 50 mg at bedtime and increase if she tolerates to treat her functional GI symptoms.  Follow up in 4 weeks   Arlyss Repress, MD

## 2017-08-01 NOTE — Discharge Instructions (Signed)
General Anesthesia, Adult, Care After °These instructions provide you with information about caring for yourself after your procedure. Your health care provider may also give you more specific instructions. Your treatment has been planned according to current medical practices, but problems sometimes occur. Call your health care provider if you have any problems or questions after your procedure. °What can I expect after the procedure? °After the procedure, it is common to have: °· Vomiting. °· A sore throat. °· Mental slowness. ° °It is common to feel: °· Nauseous. °· Cold or shivery. °· Sleepy. °· Tired. °· Sore or achy, even in parts of your body where you did not have surgery. ° °Follow these instructions at home: °For at least 24 hours after the procedure: °· Do not: °? Participate in activities where you could fall or become injured. °? Drive. °? Use heavy machinery. °? Drink alcohol. °? Take sleeping pills or medicines that cause drowsiness. °? Make important decisions or sign legal documents. °? Take care of children on your own. °· Rest. °Eating and drinking °· If you vomit, drink water, juice, or soup when you can drink without vomiting. °· Drink enough fluid to keep your urine clear or pale yellow. °· Make sure you have little or no nausea before eating solid foods. °· Follow the diet recommended by your health care provider. °General instructions °· Have a responsible adult stay with you until you are awake and alert. °· Return to your normal activities as told by your health care provider. Ask your health care provider what activities are safe for you. °· Take over-the-counter and prescription medicines only as told by your health care provider. °· If you smoke, do not smoke without supervision. °· Keep all follow-up visits as told by your health care provider. This is important. °Contact a health care provider if: °· You continue to have nausea or vomiting at home, and medicines are not helpful. °· You  cannot drink fluids or start eating again. °· You cannot urinate after 8-12 hours. °· You develop a skin rash. °· You have fever. °· You have increasing redness at the site of your procedure. °Get help right away if: °· You have difficulty breathing. °· You have chest pain. °· You have unexpected bleeding. °· You feel that you are having a life-threatening or urgent problem. °This information is not intended to replace advice given to you by your health care provider. Make sure you discuss any questions you have with your health care provider. °Document Released: 02/07/2001 Document Revised: 04/05/2016 Document Reviewed: 10/16/2015 °Elsevier Interactive Patient Education © 2018 [employer] ° °

## 2017-08-03 ENCOUNTER — Encounter
Admission: RE | Disposition: A | Discharge status: Home / Self Care | Payer: Self-pay | Source: Ambulatory Visit | Attending: Gastroenterology

## 2017-08-03 ENCOUNTER — Ambulatory Visit: Payer: Self-pay | Admitting: Anesthesiology

## 2017-08-03 ENCOUNTER — Ambulatory Visit
Admission: RE | Admit: 2017-08-03 | Discharge: 2017-08-03 | Disposition: A | Discharge status: Home / Self Care | Payer: Self-pay | Source: Ambulatory Visit | Attending: Gastroenterology | Admitting: Gastroenterology

## 2017-08-03 DIAGNOSIS — R634 Abnormal weight loss: Secondary | ICD-10-CM | POA: Insufficient documentation

## 2017-08-03 DIAGNOSIS — G43A0 Cyclical vomiting, not intractable: Secondary | ICD-10-CM

## 2017-08-03 DIAGNOSIS — Z79899 Other long term (current) drug therapy: Secondary | ICD-10-CM | POA: Insufficient documentation

## 2017-08-03 DIAGNOSIS — Z6838 Body mass index (BMI) 38.0-38.9, adult: Secondary | ICD-10-CM | POA: Insufficient documentation

## 2017-08-03 DIAGNOSIS — R112 Nausea with vomiting, unspecified: Secondary | ICD-10-CM | POA: Insufficient documentation

## 2017-08-03 DIAGNOSIS — R194 Change in bowel habit: Secondary | ICD-10-CM | POA: Insufficient documentation

## 2017-08-03 DIAGNOSIS — K2950 Unspecified chronic gastritis without bleeding: Secondary | ICD-10-CM | POA: Insufficient documentation

## 2017-08-03 HISTORY — PX: ESOPHAGOGASTRODUODENOSCOPY (EGD) WITH PROPOFOL: SHX5813

## 2017-08-03 HISTORY — PX: COLONOSCOPY WITH PROPOFOL: SHX5780

## 2017-08-03 SURGERY — COLONOSCOPY WITH PROPOFOL
Anesthesia: General | Wound class: Contaminated

## 2017-08-03 MED ORDER — GLYCOPYRROLATE 0.2 MG/ML IJ SOLN
INTRAMUSCULAR | Status: DC | PRN
  Administered 2017-08-03: 0.1 mg via INTRAVENOUS

## 2017-08-03 MED ORDER — PROPOFOL 10 MG/ML IV BOLUS
INTRAVENOUS | Status: DC | PRN
  Administered 2017-08-03: 20 mg via INTRAVENOUS
  Administered 2017-08-03: 30 mg via INTRAVENOUS
  Administered 2017-08-03: 40 mg via INTRAVENOUS
  Administered 2017-08-03: 30 mg via INTRAVENOUS
  Administered 2017-08-03: 20 mg via INTRAVENOUS
  Administered 2017-08-03: 100 mg via INTRAVENOUS
  Administered 2017-08-03: 20 mg via INTRAVENOUS
  Administered 2017-08-03: 30 mg via INTRAVENOUS
  Administered 2017-08-03: 40 mg via INTRAVENOUS
  Administered 2017-08-03 (×2): 50 mg via INTRAVENOUS
  Administered 2017-08-03: 100 mg via INTRAVENOUS
  Administered 2017-08-03: 50 mg via INTRAVENOUS

## 2017-08-03 MED ORDER — LACTATED RINGERS IV SOLN
10.0000 mL/h | INTRAVENOUS | Status: DC
  Administered 2017-08-03: 10 mL/h via INTRAVENOUS

## 2017-08-03 SURGICAL SUPPLY — 35 items

## 2017-08-03 NOTE — Op Note (Signed)
[facility] Gastroenterology Patient Name: Barbara Aguilar Procedure Date: 08/03/2017 9:37 AM MRN: [id number] Account #: [id number] Date of Birth: 11/22/1995 Admit Type: Outpatient Age: 21 Room: [facility] OR ROOM 01 Gender: Female Note Status: Finalized Procedure:            Upper GI endoscopy Indications:          Nausea with vomiting, Weight loss Providers:            Lin Landsman MD, MD Medicines:            Monitored Anesthesia Care Complications:        No immediate complications. Estimated blood loss:                        Minimal. Procedure:            Pre-Anesthesia Assessment:                       - Prior to the procedure, a History and Physical was                        performed, and patient medications and allergies were                        reviewed. The patient is competent. The risks and                        benefits of the procedure and the sedation options and                        risks were discussed with the patient. All questions                        were answered and informed consent was obtained.                        Patient identification and proposed procedure were                        verified by the physician, the nurse, the                        anesthesiologist, the anesthetist and the technician in                        the pre-procedure area in the procedure room. Mental                        Status Examination: alert and oriented. Airway                        Examination: normal oropharyngeal airway and neck                        mobility. Respiratory Examination: clear to                        auscultation. CV Examination: normal. Prophylactic                        Antibiotics: The patient does not  require prophylactic                        antibiotics. Prior Anticoagulants: The patient has                        taken no previous anticoagulant or antiplatelet agents.                        ASA Grade  Assessment: II - A patient with mild systemic                        disease. After reviewing the risks and benefits, the                        patient was deemed in satisfactory condition to undergo                        the procedure. The anesthesia plan was to use monitored                        anesthesia care (MAC). Immediately prior to                        administration of medications, the patient was                        re-assessed for adequacy to receive sedatives. The                        heart rate, respiratory rate, oxygen saturations, blood                        pressure, adequacy of pulmonary ventilation, and                        response to care were monitored throughout the                        procedure. The physical status of the patient was                        re-assessed after the procedure.                       After obtaining informed consent, the endoscope was                        passed under direct vision. Throughout the procedure,                        the patient's blood pressure, pulse, and oxygen                        saturations were monitored continuously. The Olympus                        GIF H180J Endoscope (T#:[id number]) was introduced through                        the mouth, and advanced to  the second part of duodenum.                        The upper GI endoscopy was accomplished without                        difficulty. The patient tolerated the procedure well. Findings:      The duodenal bulb and second portion of the duodenum were normal.       Biopsies for histology were taken with a cold forceps for evaluation of       celiac disease.      The entire examined stomach was normal. Biopsies were taken with a cold       forceps for Helicobacter pylori testing.      The gastroesophageal junction and examined esophagus were normal. Impression:           - Normal duodenal bulb and second portion of the                         duodenum. Biopsied.                       - Normal stomach. Biopsied.                       - Normal gastroesophageal junction and esophagus. Recommendation:       - Await pathology results.                       - proceed with colonoscopy Procedure Code(s):    --- Professional ---                       [phone], Esophagogastroduodenoscopy, flexible, transoral;                        with biopsy, single or multiple Diagnosis Code(s):    --- Professional ---                       R11.2, Nausea with vomiting, unspecified                       R63.4, Abnormal weight loss CPT copyright 2016 American Medical Association. All rights reserved. The codes documented in this report are preliminary and upon coder review may  be revised to meet current compliance requirements. Dr. Ulyess Mort Lin Landsman MD, MD 08/03/2017 10:04:16 AM This report has been signed electronically. Number of Addenda: 0 Note Initiated On: 08/03/2017 9:37 AM      [facility]

## 2017-08-03 NOTE — Anesthesia Postprocedure Evaluation (Signed)
Anesthesia Post Note  Patient: Barbara Aguilar  Procedure(s) Performed: Procedure(s) (LRB): COLONOSCOPY WITH PROPOFOL (N/A) ESOPHAGOGASTRODUODENOSCOPY (EGD) WITH PROPOFOL (N/A)  Patient location during evaluation: PACU Anesthesia Type: General Level of consciousness: awake and alert Pain management: pain level controlled Vital Signs Assessment: post-procedure vital signs reviewed and stable Respiratory status: spontaneous breathing, nonlabored ventilation, respiratory function stable and patient connected to nasal cannula oxygen Cardiovascular status: blood pressure returned to baseline and stable Postop Assessment: no apparent nausea or vomiting Anesthetic complications: no    Grethel Zenk ELAINE

## 2017-08-03 NOTE — H&P (Signed)
  Arlyss Repress, MD [address]  [address]  [place], [place] [address]  Main: [phone]  Fax: [phone] Pager: [phone]  Primary Care Physician:  Patient, No Pcp Per Primary Gastroenterologist:  Dr. Arlyss Repress  Pre-Procedure History & Physical: HPI:  Barbara Aguilar is a 21 y.o. female is here for an endoscopy and colonoscopy.   History reviewed. No pertinent past medical history.  Past Surgical History:  Procedure Laterality Date  . NO PAST SURGERIES      Prior to Admission medications   Medication Sig Start Date End Date Taking? Authorizing Provider  omeprazole (PRILOSEC OTC) 20 MG tablet Take 20 mg by mouth daily.   Yes [provider]  amitriptyline (ELAVIL) 50 MG tablet Take 1 tablet (50 mg total) by mouth at bedtime. 07/11/17 08/10/17  Toney Reil, MD  docusate sodium (COLACE) 100 MG capsule Take 1 capsule (100 mg total) by mouth daily as needed for mild constipation or moderate constipation. 06/27/17 06/27/18  Myrna Blazer, MD  metoCLOPramide (REGLAN) 10 MG tablet Take 1 tablet (10 mg total) by mouth every 6 (six) hours as needed. Patient not taking: Reported on 07/11/2017 07/08/17   Bridget Hartshorn L, PA-C  ondansetron (ZOFRAN ODT) 4 MG disintegrating tablet Take 1 tablet (4 mg total) by mouth every 8 (eight) hours as needed for nausea or vomiting. Patient not taking: Reported on 07/11/2017 07/08/17   Bridget Hartshorn L, PA-C  ondansetron (ZOFRAN) 4 MG tablet Take 4 mg by mouth. 06/20/17   [provider]  PROMETHEGAN 25 MG suppository UNWRAP AND INSERT 1 SUPPOSITORY INTO THE RECTUM Q 6 H PRN N FOR UP TO 7 DAYS 06/17/17   [provider]    Allergies as of 07/11/2017  . (No Known Allergies)    History reviewed. No pertinent family history.  Social History   Social History  . Marital status: Single    Spouse name: N/A  . Number of children: N/A  . Years of education: N/A   Occupational History  . Not on  file.   Social History Main Topics  . Smoking status: Never Smoker  . Smokeless tobacco: Never Used  . Alcohol use Yes     Comment: Occasional  . Drug use: Yes    Types: Marijuana  . Sexual activity: Yes   Other Topics Concern  . Not on file   Social History Narrative  . No narrative on file    Review of Systems: See HPI, otherwise negative ROS  Physical Exam: LMP 06/24/2017  General:   Alert,  pleasant and cooperative in NAD Head:  Normocephalic and atraumatic. Neck:  Supple; no masses or thyromegaly. Lungs:  Clear throughout to auscultation.    Heart:  Regular rate and rhythm. Abdomen:  Soft, nontender and nondistended. Normal bowel sounds, without guarding, and without rebound.   Neurologic:  Alert and  oriented x4;  grossly normal neurologically.  Impression/Plan: Barbara Aguilar is here for an endoscopy and colonoscopy to be performed for altered bowel habits, nausea, vomiting  Risks, benefits, limitations, and alternatives regarding  endoscopy and colonoscopy have been reviewed with the patient.  Questions have been answered.  All parties agreeable.   Lannette Donath, MD  08/03/2017, 8:38 AM

## 2017-08-03 NOTE — Transfer of Care (Signed)
Immediate Anesthesia Transfer of Care Note  Patient: Barbara Aguilar  Procedure(s) Performed: Procedure(s): COLONOSCOPY WITH PROPOFOL (N/A) ESOPHAGOGASTRODUODENOSCOPY (EGD) WITH PROPOFOL (N/A)  Patient Location: PACU  Anesthesia Type: General  Level of Consciousness: awake, alert  and patient cooperative  Airway and Oxygen Therapy: Patient Spontanous Breathing and Patient connected to supplemental oxygen  Post-op Assessment: Post-op Vital signs reviewed, Patient's Cardiovascular Status Stable, Respiratory Function Stable, Patent Airway and No signs of Nausea or vomiting  Post-op Vital Signs: Reviewed and stable  Complications: No apparent anesthesia complications

## 2017-08-03 NOTE — Anesthesia Preprocedure Evaluation (Signed)
Anesthesia Evaluation  Patient identified by MRN, date of birth, ID band Patient awake    Reviewed: Allergy & Precautions, H&P , NPO status , Patient's Chart, lab work & pertinent test results, reviewed documented beta blocker date and time   Airway Mallampati: II  TM Distance: >3 FB Neck ROM: full    Dental no notable dental hx.    Pulmonary neg pulmonary ROS,    Pulmonary exam normal breath sounds clear to auscultation       Cardiovascular Exercise Tolerance: Good negative cardio ROS   Rhythm:regular Rate:Normal     Neuro/Psych negative neurological ROS  negative psych ROS   GI/Hepatic negative GI ROS, Neg liver ROS,   Endo/Other  negative endocrine ROS  Renal/GU negative Renal ROS  negative genitourinary   Musculoskeletal   Abdominal   Peds  Hematology negative hematology ROS (+)   Anesthesia Other Findings Marijuana use  Reproductive/Obstetrics negative OB ROS                            Anesthesia Physical Anesthesia Plan  ASA: II  Anesthesia Plan: General   Post-op Pain Management:    Induction:   PONV Risk Score and Plan:   Airway Management Planned:   Additional Equipment:   Intra-op Plan:   Post-operative Plan:   Informed Consent: I have reviewed the patients History and Physical, chart, labs and discussed the procedure including the risks, benefits and alternatives for the proposed anesthesia with the patient or authorized representative who has indicated his/her understanding and acceptance.   Dental Advisory Given  Plan Discussed with: CRNA  Anesthesia Plan Comments:         Anesthesia Quick Evaluation

## 2017-08-03 NOTE — Op Note (Signed)
[facility] Patient Name: Barbara Aguilar Procedure Date: 08/03/2017 10:04 AM MRN: [id number] Account #: [id number] Date of Birth: November 15, 1996 Admit Type: Outpatient Age: 21 Room: [facility] OR ROOM 01 Gender: Female Note Status: Finalized Procedure:            Colonoscopy Indications:          Weight loss, Altered bowel habits Providers:            Lin Landsman MD, MD Medicines:            Monitored Anesthesia Care Complications:        No immediate complications. Procedure:            Pre-Anesthesia Assessment:                       - Prior to the procedure, a History and Physical was                        performed, and patient medications and allergies were                        reviewed. The patient is competent. The risks and                        benefits of the procedure and the sedation options and                        risks were discussed with the patient. All questions                        were answered and informed consent was obtained.                        Patient identification and proposed procedure were                        verified by the physician, the nurse, the                        anesthesiologist, the anesthetist and the technician in                        the pre-procedure area in the procedure room. Mental                        Status Examination: alert and oriented. Airway                        Examination: normal oropharyngeal airway and neck                        mobility. Respiratory Examination: clear to                        auscultation. CV Examination: normal. Prophylactic                        Antibiotics: The patient does not require prophylactic                        antibiotics. Prior Anticoagulants: The  patient has                        taken no previous anticoagulant or antiplatelet agents.                        ASA Grade Assessment: II - A patient with mild systemic   disease. After reviewing the risks and benefits, the                        patient was deemed in satisfactory condition to undergo                        the procedure. The anesthesia plan was to use monitored                        anesthesia care (MAC). Immediately prior to                        administration of medications, the patient was                        re-assessed for adequacy to receive sedatives. The                        heart rate, respiratory rate, oxygen saturations, blood                        pressure, adequacy of pulmonary ventilation, and                        response to care were monitored throughout the                        procedure. The physical status of the patient was                        re-assessed after the procedure.                       After obtaining informed consent, the colonoscope was                        passed under direct vision. Throughout the procedure,                        the patient's blood pressure, pulse, and oxygen                        saturations were monitored continuously. The Olympus                        Colonoscope [id number]) was introduced through the                        anus and advanced to the the terminal ileum. The                        colonoscopy was performed without difficulty. The  patient tolerated the procedure well. The quality of                        the bowel preparation was evaluated using the BBPS                        [facility] Bowel Preparation Scale) with scores of: Right                        Colon = 3, Transverse Colon = 3 and Left Colon = 3                        (entire mucosa seen well with no residual staining,                        small fragments of stool or opaque liquid). The total                        BBPS score equals 9. Findings:      The perianal and digital rectal examinations were normal. Pertinent       negatives include normal sphincter tone  and no palpable rectal lesions.      The terminal ileum appeared normal.      The colon (entire examined portion) appeared normal.      Retroflexion in rectum was not performed Impression:           - The examined portion of the ileum was normal.                       - The entire examined colon is normal.                       - No specimens collected. Recommendation:       - Discharge patient to home.                       - Resume regular diet today.                       - Continue present medications.                       - Return to GI clinic as previously scheduled. Procedure Code(s):    --- Professional ---                       [id number], Colonoscopy, flexible; diagnostic, including                        collection of specimen(s) by brushing or washing, when                        performed (separate procedure) Diagnosis Code(s):    --- Professional ---                       R63.4, Abnormal weight loss CPT copyright 2016 [employer]. All rights reserved. The codes documented in this report are preliminary and upon coder review may  be revised to meet current compliance requirements. Dr. Ulyess Mort Lin Landsman MD, MD 08/03/2017 10:21:17 AM This report has  been signed electronically. Number of Addenda: 0 Note Initiated On: 08/03/2017 10:04 AM Scope Withdrawal Time: 0 hours 6 minutes 19 seconds  Total Procedure Duration: 0 hours 9 minutes 54 seconds       [facility] [place]

## 2017-08-04 ENCOUNTER — Encounter: Payer: Self-pay | Admitting: Gastroenterology

## 2017-08-09 ENCOUNTER — Encounter: Payer: Self-pay | Admitting: Gastroenterology

## 2017-08-10 ENCOUNTER — Ambulatory Visit: Payer: Self-pay | Admitting: Gastroenterology

## 2017-08-10 ENCOUNTER — Other Ambulatory Visit: Payer: Self-pay | Admitting: Gastroenterology

## 2017-08-10 DIAGNOSIS — K529 Noninfective gastroenteritis and colitis, unspecified: Secondary | ICD-10-CM

## 2017-08-11 ENCOUNTER — Telehealth: Payer: Self-pay

## 2017-08-11 NOTE — Telephone Encounter (Signed)
Patient notified of the following message and she will go for labs prior to office visit.  Notified pt herrecent biopsies from upper endoscopy came back suggestive of possible celiac disease. I ordered blood tests for celiac disease. She needs to get those done prior to seeing me on 08/26/2017.  Thanks [employer]

## 2017-08-26 ENCOUNTER — Encounter: Payer: Self-pay | Admitting: Gastroenterology

## 2017-08-26 ENCOUNTER — Ambulatory Visit: Payer: Self-pay | Admitting: Gastroenterology

## 2019-04-10 IMAGING — CT CT ABD-PELV W/ CM
2 of 4 series · 15 of 46 positions shown, 17 images · IV contrast (APPLIED)
Comparison: None.

CLINICAL DATA: Lower abdominal pain with heartburn and vomiting
since [REDACTED].

EXAM:
CT ABDOMEN AND PELVIS WITH CONTRAST
TECHNIQUE: Multidetector CT imaging of the abdomen and pelvis was performed
using the standard protocol following bolus administration of
intravenous contrast.
CONTRAST:  100mL BMMXCP-7KK IOPAMIDOL (BMMXCP-7KK) INJECTION 61%

[Series 2: routine abd/pel with · axial · 0.77mm/px · z∈[-1128,-643]mm · 12 of 107 slices shown, 14 images]
[im 5/107  soft-tissue]
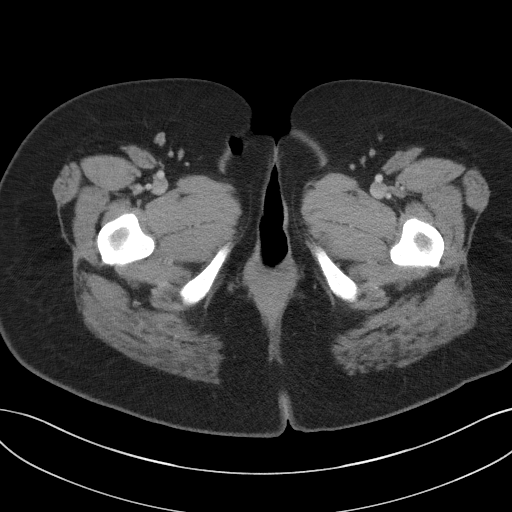
[im 5/107  bone]
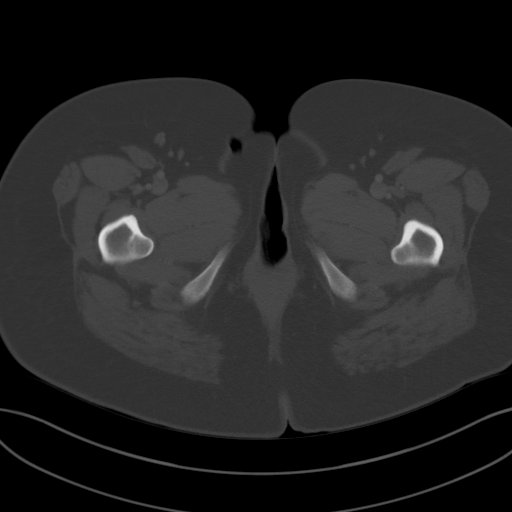
[im 14/107  soft-tissue]
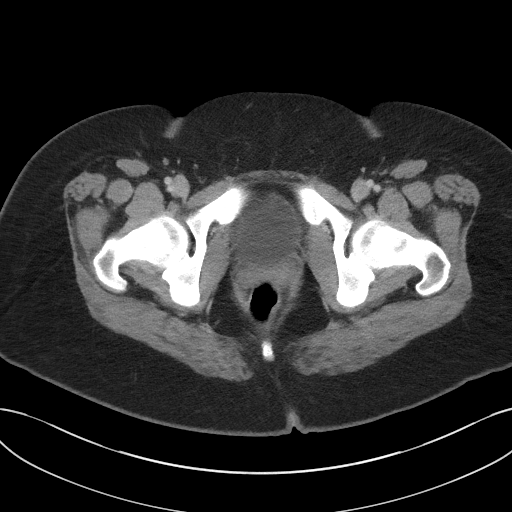
[im 23/107  soft-tissue]
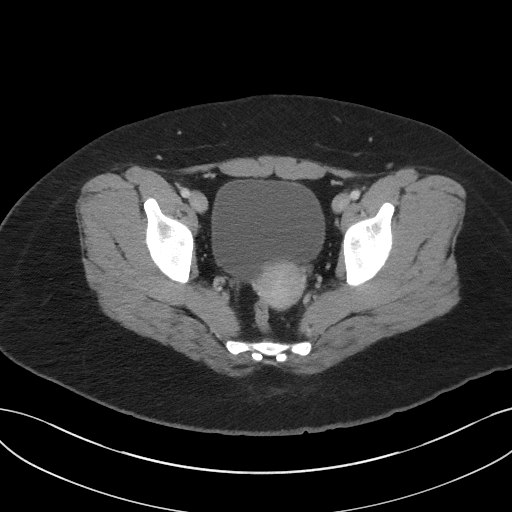
[im 31/107  soft-tissue]
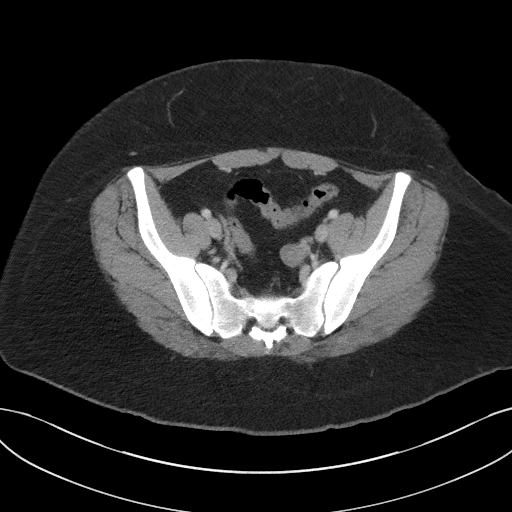
[im 40/107  soft-tissue]
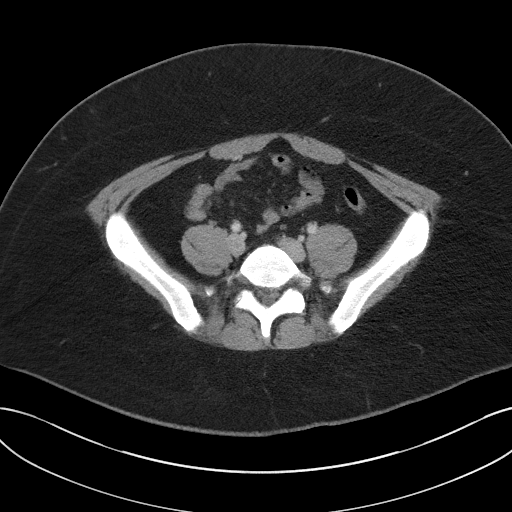
[im 49/107  soft-tissue]
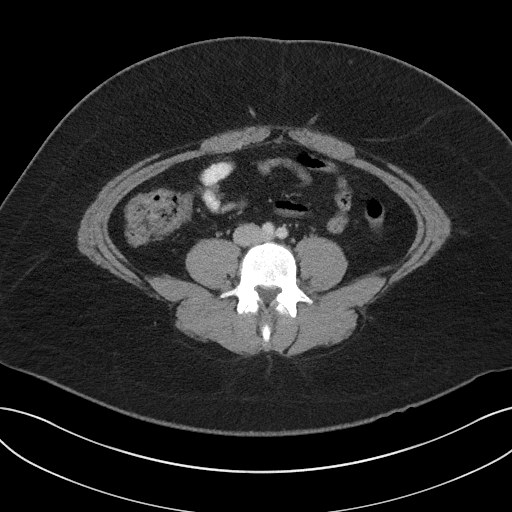
[im 58/107  soft-tissue]
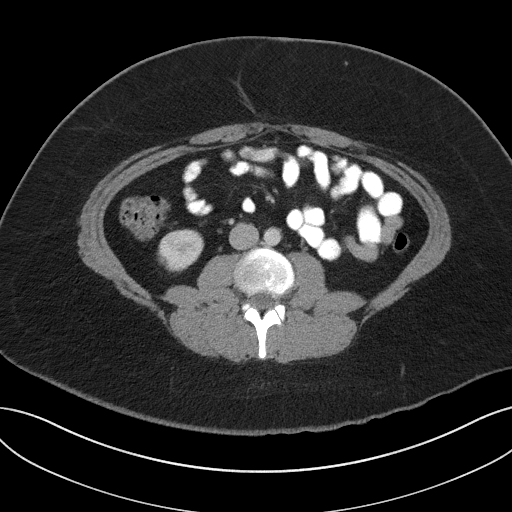
[im 67/107  soft-tissue]
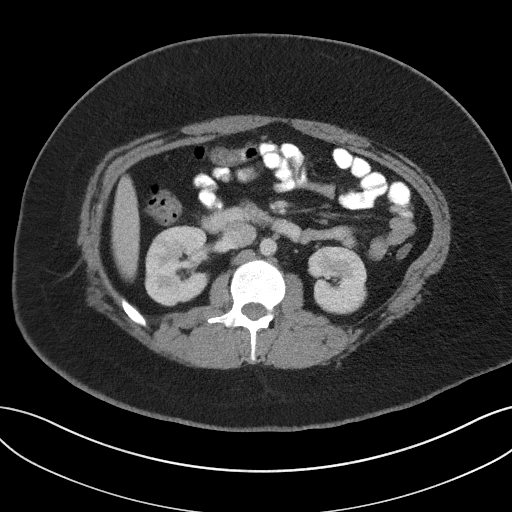
[im 76/107  soft-tissue]
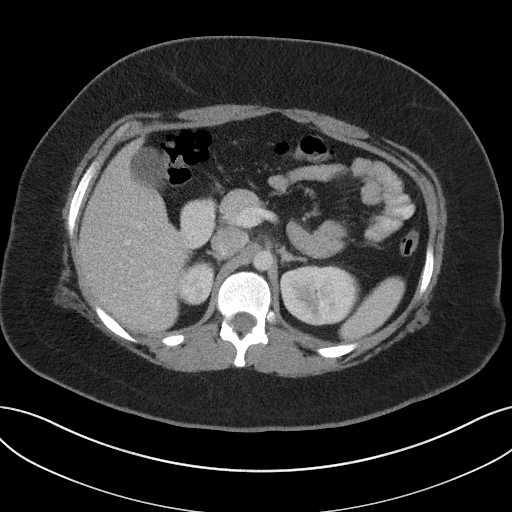
[im 76/107  bone]
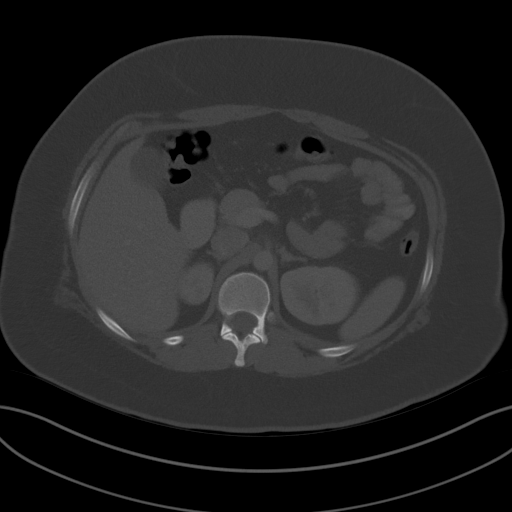
[im 84/107  soft-tissue]
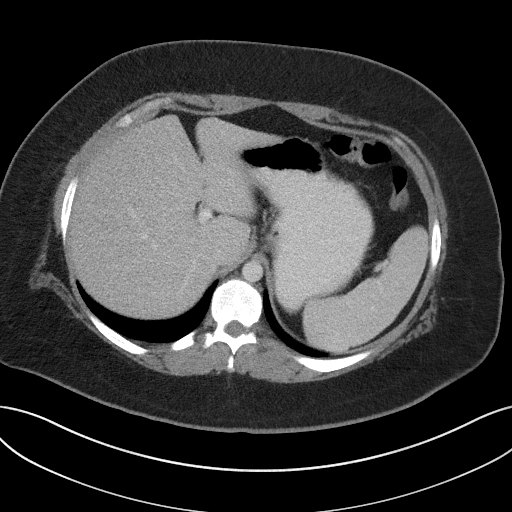
[im 93/107  soft-tissue]
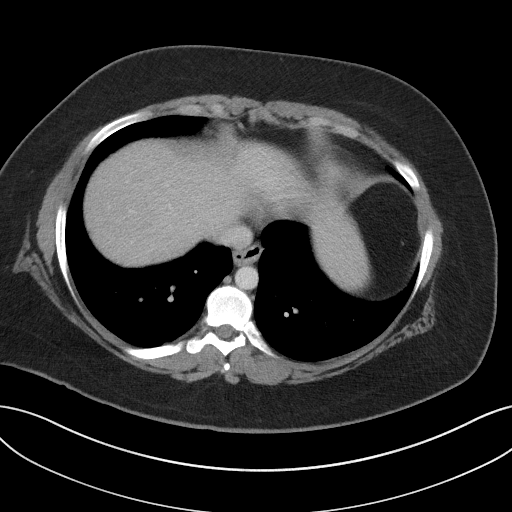
[im 102/107  soft-tissue]
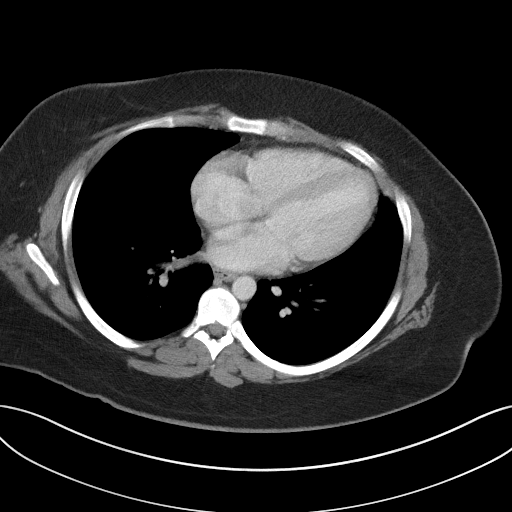

[Series 5: coronal st · coronal · 0.79mm/px · 3 of 90 slices shown]
[im 30/90  soft-tissue]
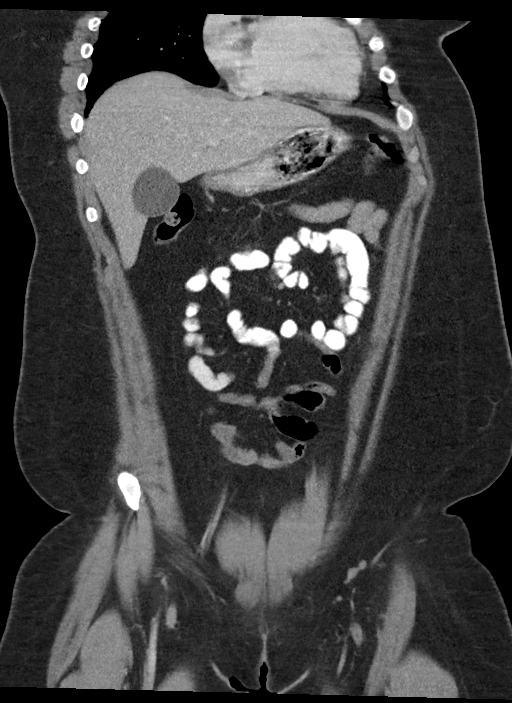
[im 40/90  soft-tissue]
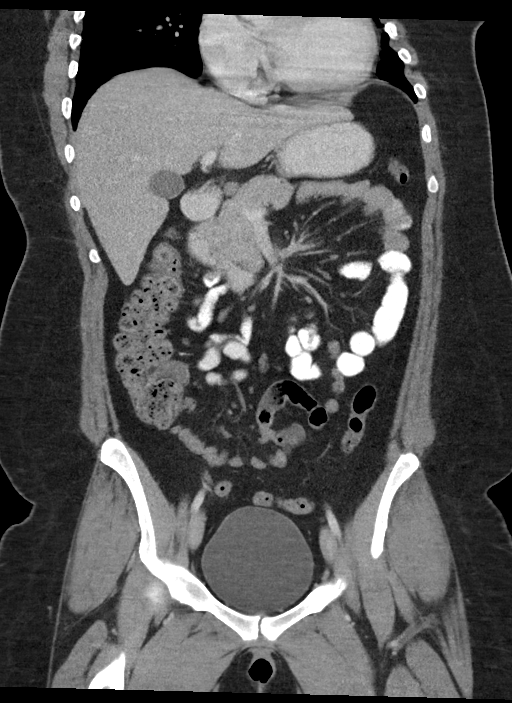
[im 50/90  soft-tissue]
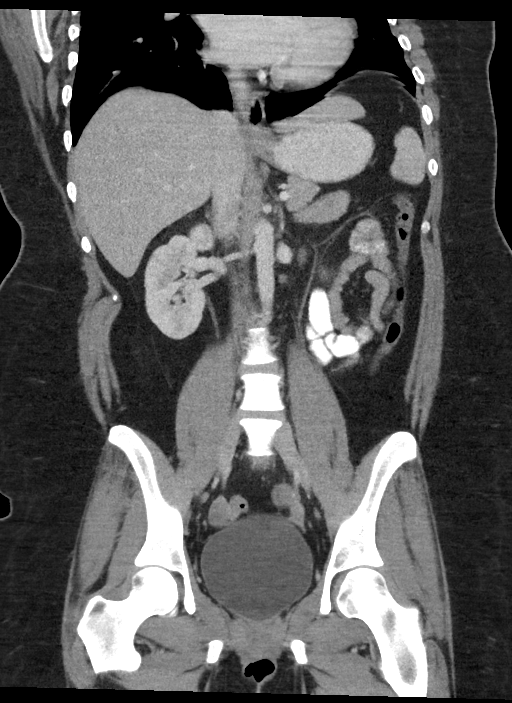

[15 of 46 positions shown; findings below may reference images not displayed]

FINDINGS: Lower chest:  Unremarkable.

Hepatobiliary: No focal abnormality within the liver parenchyma.
There is no evidence for gallstones, gallbladder wall thickening, or
pericholecystic fluid. No intrahepatic or extrahepatic biliary
dilation.

Pancreas: No focal mass lesion. No dilatation of the main duct. No
intraparenchymal cyst. No peripancreatic edema.

Spleen: No splenomegaly. No focal mass lesion.

Adrenals/Urinary Tract: No adrenal nodule or mass. Kidneys are
unremarkable. No evidence for hydroureter. The urinary bladder
appears normal for the degree of distention.

Stomach/Bowel: Stomach is nondistended. No gastric wall thickening.
No evidence of outlet obstruction. Duodenum is normally positioned
as is the ligament of Treitz. No small bowel wall thickening. No
small bowel dilatation. The terminal ileum is normal. The appendix
is normal. No gross colonic mass. No colonic wall thickening. No
substantial diverticular change.

Vascular/Lymphatic: No abdominal aortic aneurysm. No abdominal
aortic atherosclerotic calcification. Celiac axis, SMA, and IMA are
patent. Portal vein and superior mesenteric vein are patent. There
is no gastrohepatic or hepatoduodenal ligament lymphadenopathy. No
intraperitoneal or retroperitoneal lymphadenopathy. No pelvic
sidewall lymphadenopathy.

Reproductive: The uterus has normal CT imaging appearance. There is
no adnexal mass.

Other: No intraperitoneal free fluid.

Musculoskeletal: Bone windows reveal no worrisome lytic or sclerotic
osseous lesions.
IMPRESSION: 1. Unremarkable CT scan of the abdomen and pelvis. Specifically, no
findings to explain the patient's history of abdominal pain and
vomiting.
